# Patient Record
Sex: Male | Born: 1980 | Race: White | Hispanic: No
Health system: Southern US, Community
[De-identification: ages and names within clinical notes are randomized; demographics above are authoritative.]

---

## 2014-01-30 ENCOUNTER — Other Ambulatory Visit (HOSPITAL_COMMUNITY): Payer: Self-pay | Admitting: Physician Assistant

## 2014-01-30 DIAGNOSIS — R1032 Left lower quadrant pain: Secondary | ICD-10-CM

## 2014-01-30 DIAGNOSIS — R1013 Epigastric pain: Secondary | ICD-10-CM

## 2014-02-02 ENCOUNTER — Encounter (HOSPITAL_COMMUNITY): Payer: Self-pay

## 2014-02-02 ENCOUNTER — Other Ambulatory Visit (HOSPITAL_COMMUNITY): Payer: Self-pay | Admitting: Physician Assistant

## 2014-02-02 ENCOUNTER — Ambulatory Visit (HOSPITAL_COMMUNITY)
Admission: RE | Admit: 2014-02-02 | Discharge: 2014-02-02 | Disposition: A | Discharge status: Home / Self Care | Payer: BC Managed Care – PPO | Source: Ambulatory Visit | Attending: Physician Assistant | Admitting: Physician Assistant

## 2014-02-02 DIAGNOSIS — R1013 Epigastric pain: Secondary | ICD-10-CM

## 2014-02-02 DIAGNOSIS — R1032 Left lower quadrant pain: Secondary | ICD-10-CM

## 2014-02-02 DIAGNOSIS — N200 Calculus of kidney: Secondary | ICD-10-CM | POA: Insufficient documentation

## 2014-02-02 MED ORDER — IOHEXOL 300 MG/ML  SOLN: 100.0000 mL | Freq: Once | INTRAMUSCULAR | Status: DC | PRN

## 2014-02-02 NOTE — Progress Notes (Signed)
Patient complained of having a numb tongue after drinking oral Omnipaque 300/7450ml in bree[place].  Pt has no prior hx of CT scanning or use of IV contrast on him. I called Dr Amil AmenEdmunds, Radiologist, at [phone] to make him aware of patient problem. He recommended that  We not give the patient any IV contrast at this time since there are no radiologist here in the building to  Evaluate the patient.   Per Dr Amil AmenEdmunds, this is not necessarily a contrast reaction, but we could not proceed with IV contrast  Until the patient can be evaluated by a radiologist prior to receiving the IV contrast.

## 2014-12-19 IMAGING — CT CT ABD-PELV W/O CM
2 of 4 series · 16 of 46 positions shown, 18 images · non-contrast
Comparison: None.

CLINICAL DATA: 32-year-old male with upper abdominal, epigastric
pain. Initial encounter.

EXAM:
CT ABDOMEN AND PELVIS WITHOUT CONTRAST
TECHNIQUE: Multidetector CT imaging of the abdomen and pelvis was performed
following the standard protocol without intravenous contrast.

[Series 2: abdomen/pelvis w/o contrast · axial · non-contrast · 0.77mm/px · z∈[-448,-23]mm · 13 of 93 slices shown, 15 images]
[im 4/93  soft-tissue]
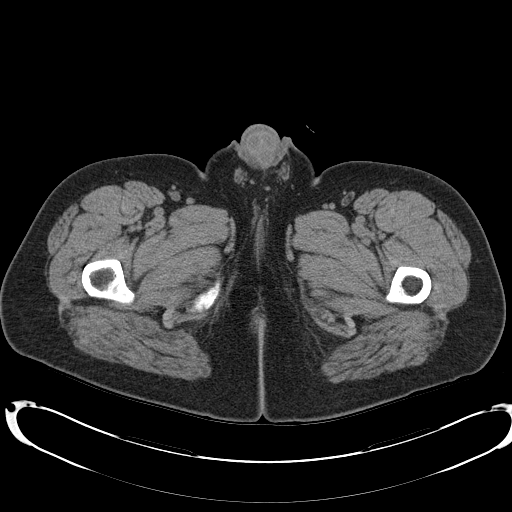
[im 4/93  bone]
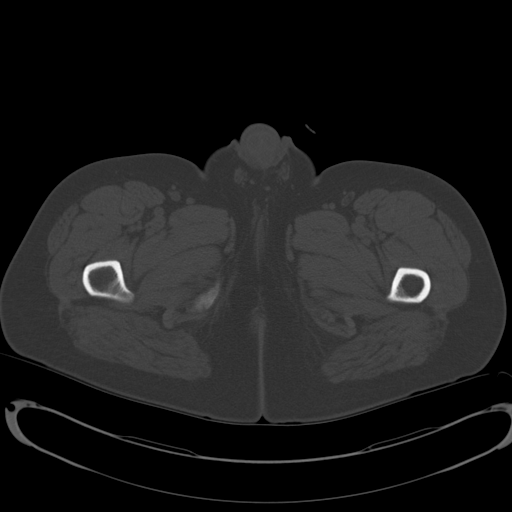
[im 12/93  soft-tissue]
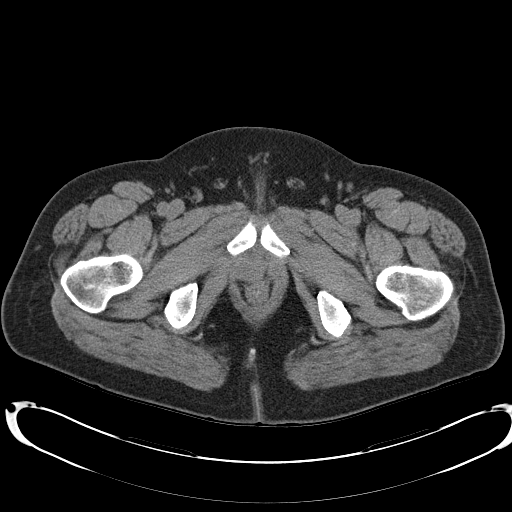
[im 19/93  soft-tissue]
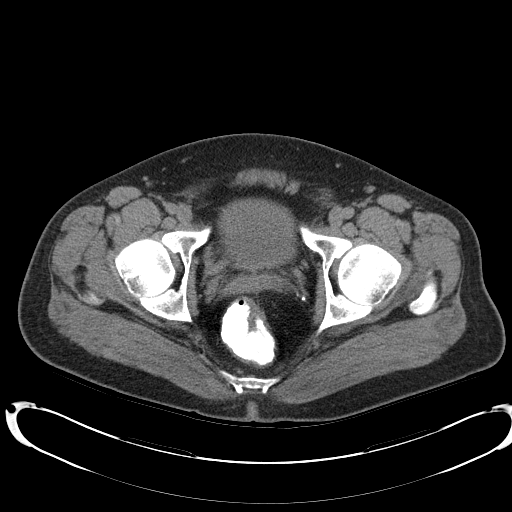
[im 26/93  soft-tissue]
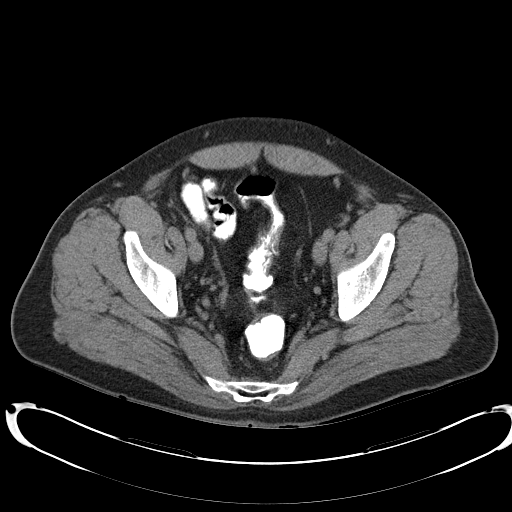
[im 34/93  soft-tissue]
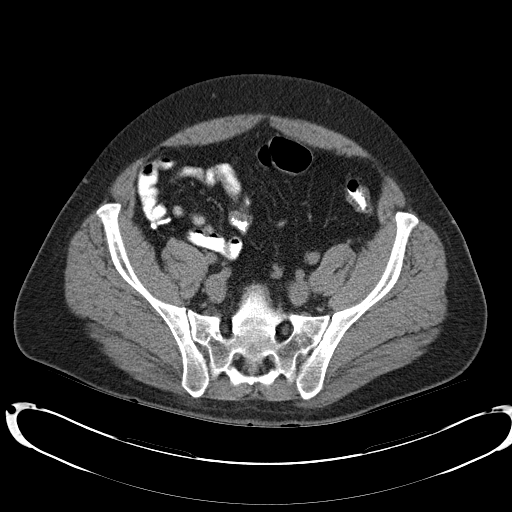
[im 41/93  soft-tissue]
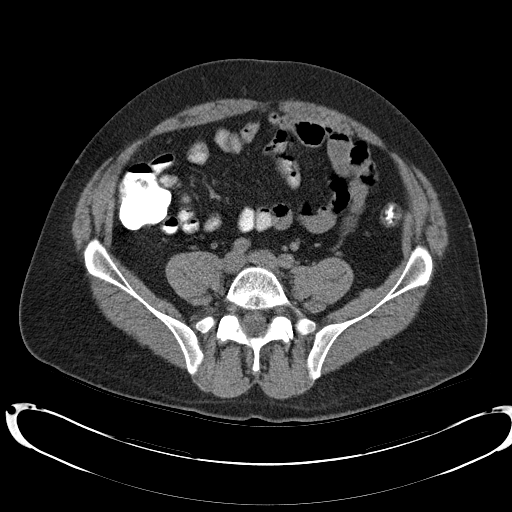
[im 48/93  soft-tissue]
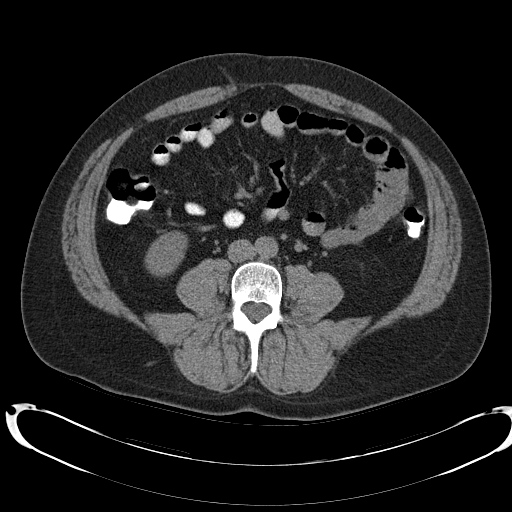
[im 52/93  soft-tissue]
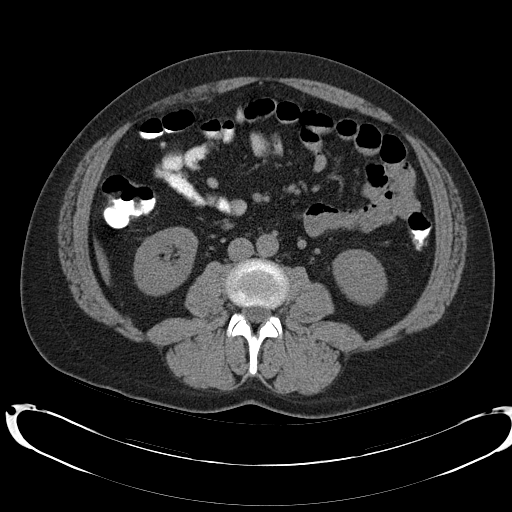
[im 59/93  soft-tissue]
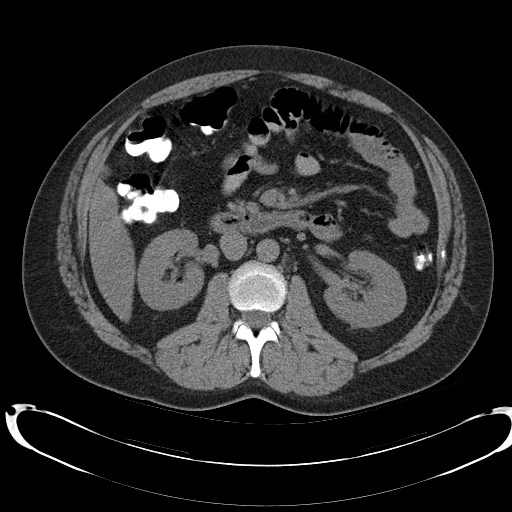
[im 59/93  bone]
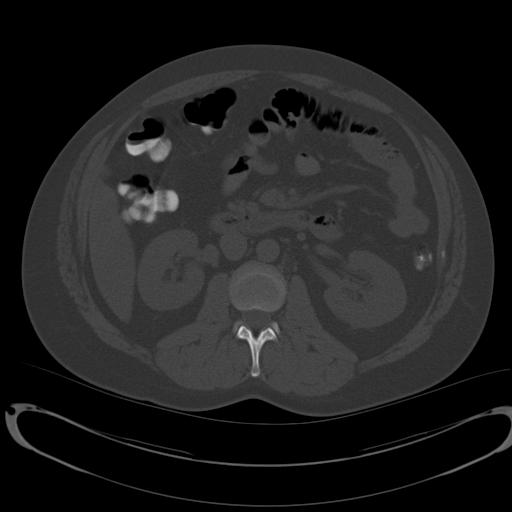
[im 67/93  soft-tissue]
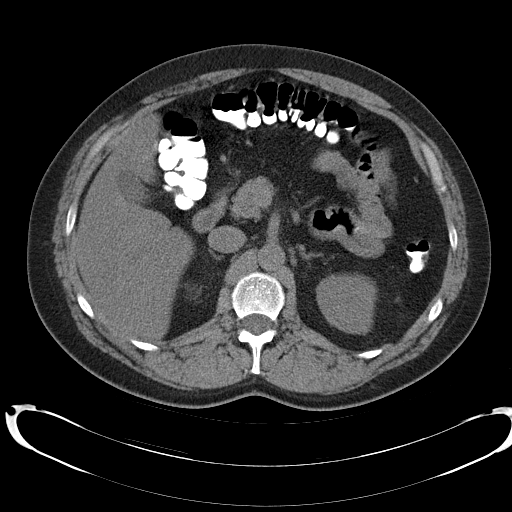
[im 74/93  soft-tissue]
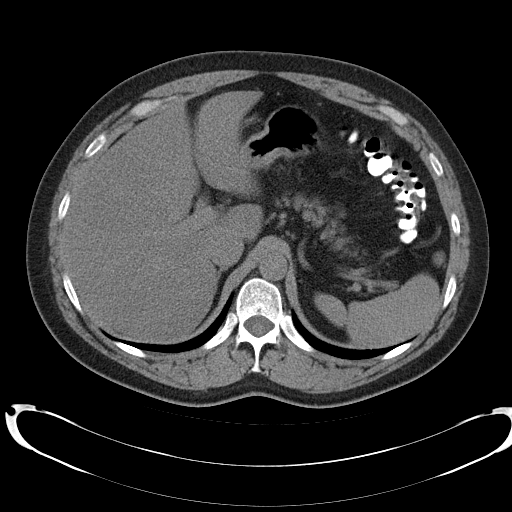
[im 81/93  soft-tissue]
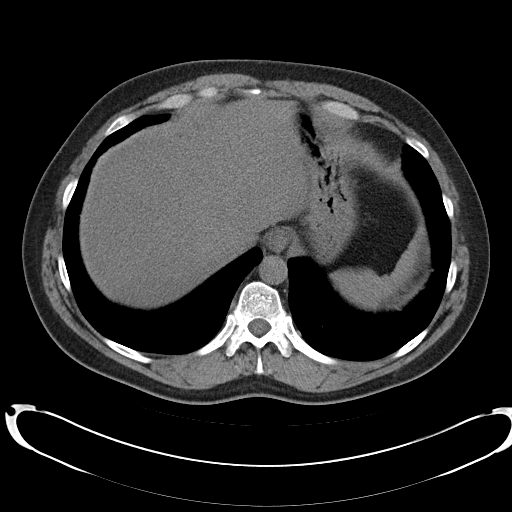
[im 89/93  soft-tissue]
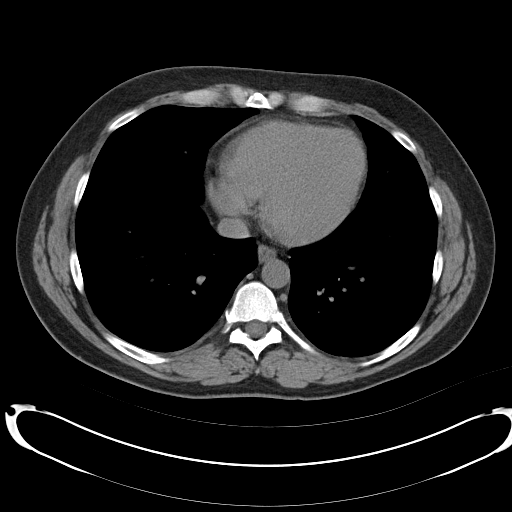

[Series 4: mpr cor (id) · coronal · 0.72mm/px · 3 of 89 slices shown]
[im 30/89  soft-tissue]
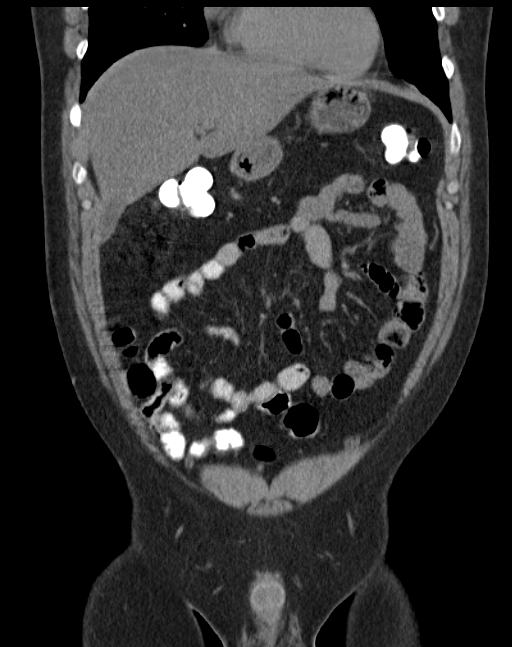
[im 40/89  soft-tissue]
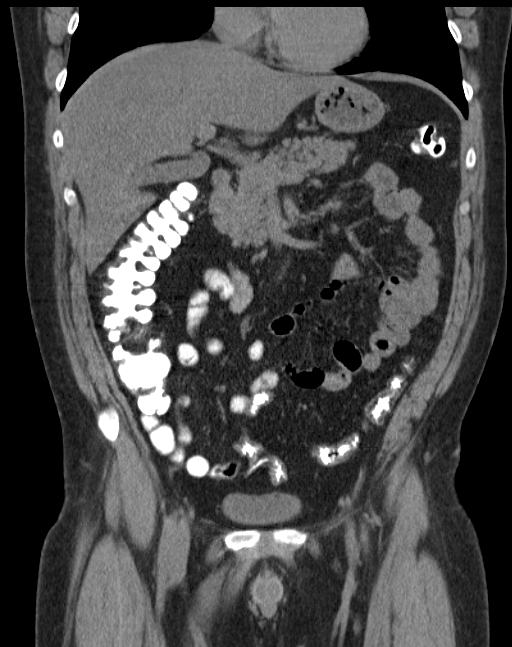
[im 49/89  soft-tissue]
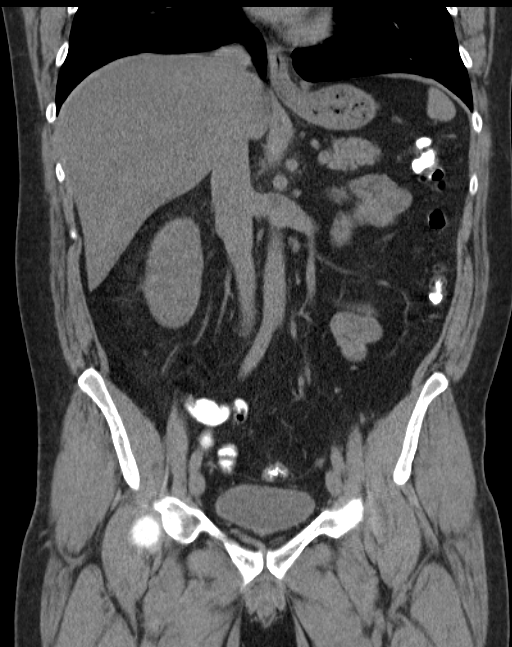

[16 of 46 positions shown; findings below may reference images not displayed]

CONTRAST:  IV contrast was planned, but the patient reported oral
numbness after drinking the oral contrast. Decision was made to
defer IV contrast at this time, as it is unclear whether not the
patient might have an IV contrast allergy. In the future, it may be
prudent to proceed any IV contrast administration with the standard
13 hr steroid premedication.
( available online:
)
FINDINGS: Negative lung bases.  No pericardial or pleural effusion.

No acute osseous abnormality identified. L5-S1 disc protrusion is
evident. No evidence of lumbar spinal stenosis.

No pelvic free fluid. Oral contrast has reached the rectum.
Unremarkable bladder. Negative distal colon except for redundancy of
the sigmoid. Negative left colon. Transverse colon within normal
limits. Mildly redundant hepatic flexure. Negative right colon and
appendix. Terminal ileum within normal limits. No dilated small
bowel. Negative stomach and duodenum.

Mildly decreased density throughout the liver. Otherwise negative
noncontrast liver, gallbladder, spleen, pancreas, and adrenal
glands. No abdominal free fluid. Small splenule in the left upper
quadrant.

No hydronephrosis or perinephric stranding. No hydroureter or
ureteral calculus. No abdominal free fluid. Tiny bilateral intra
renal calculi (right upper pole coronal image 54, left lower pole
coronal image 58). No lymphadenopathy. No focal inflammatory
stranding.
IMPRESSION: 1. No acute or inflammatory process identified in the abdomen or
pelvis.
2. Small nonobstructing bilateral nephrolithiasis.
3. The patient reported oral numbness after drinking contrast
material. Subsequently, IV contrast was deferred at this time. See
details above.
